# Patient Record
Sex: Male | Born: 1985 | Race: White | Hispanic: No | Marital: Single | Smoking: Never smoker
Health system: Southern US, Community
[De-identification: ages and names within clinical notes are randomized; demographics above are authoritative.]

---
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
## 2013-12-09 HISTORY — PX: FACIAL RECONSTRUCTION SURGERY: SHX631

## 2016-04-10 ENCOUNTER — Encounter (HOSPITAL_COMMUNITY): Payer: Self-pay | Admitting: Oncology

## 2016-04-10 ENCOUNTER — Emergency Department (HOSPITAL_COMMUNITY)
Admission: EM | Admit: 2016-04-10 | Discharge: 2016-04-10 | Disposition: A | Discharge status: Home / Self Care | Payer: Self-pay | Attending: Emergency Medicine | Admitting: Emergency Medicine

## 2016-04-10 ENCOUNTER — Emergency Department (HOSPITAL_COMMUNITY): Payer: Self-pay

## 2016-04-10 DIAGNOSIS — Y9289 Other specified places as the place of occurrence of the external cause: Secondary | ICD-10-CM | POA: Insufficient documentation

## 2016-04-10 DIAGNOSIS — S83005A Unspecified dislocation of left patella, initial encounter: Secondary | ICD-10-CM

## 2016-04-10 DIAGNOSIS — Y9389 Activity, other specified: Secondary | ICD-10-CM | POA: Insufficient documentation

## 2016-04-10 DIAGNOSIS — X500XXA Overexertion from strenuous movement or load, initial encounter: Secondary | ICD-10-CM | POA: Insufficient documentation

## 2016-04-10 DIAGNOSIS — S83015A Lateral dislocation of left patella, initial encounter: Secondary | ICD-10-CM | POA: Insufficient documentation

## 2016-04-10 DIAGNOSIS — Y990 Civilian activity done for income or pay: Secondary | ICD-10-CM | POA: Insufficient documentation

## 2016-04-10 MED ORDER — HYDROCODONE-ACETAMINOPHEN 5-325 MG PO TABS: 1.0000 | ORAL_TABLET | Freq: Four times a day (QID) | ORAL | Status: AC | PRN

## 2016-04-10 MED ORDER — PROPOFOL 10 MG/ML IV BOLUS
0.5000 mg/kg | Freq: Once | INTRAVENOUS | Status: AC
  Administered 2016-04-10: 49.9 mg via INTRAVENOUS
  Filled 2016-04-10: qty 20

## 2016-04-10 MED ORDER — HYDROMORPHONE HCL 1 MG/ML IJ SOLN: 1.0000 mg | Freq: Once | INTRAMUSCULAR | Status: DC

## 2016-04-10 MED ORDER — HYDROMORPHONE HCL 1 MG/ML IJ SOLN
1.0000 mg | Freq: Once | INTRAMUSCULAR | Status: AC
Start: 2016-04-10 — End: 2016-04-10
  Administered 2016-04-10: 1 mg via INTRAVENOUS
  Filled 2016-04-10: qty 1

## 2016-04-10 MED ORDER — HYDROMORPHONE HCL 2 MG/ML IJ SOLN
2.0000 mg | Freq: Once | INTRAMUSCULAR | Status: AC
  Administered 2016-04-10: 2 mg via INTRAVENOUS
  Filled 2016-04-10: qty 1

## 2016-04-10 NOTE — ED Notes (Signed)
Bed: WA15 Expected date:  Expected time:  Means of arrival:  Comments: EMS  

## 2016-04-10 NOTE — Consult Note (Signed)
Reason for Consult: Left knee pain Referring Physician:Dr Delo  Gordy SaversChristopher Madden is an 30 y.o. male.  HPI: Derek DeerChristopher is a 30 year old patient with left knee pain.  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  Had immediate onset of pain and deformity.  Was seen in the emergency room.  CT scan subsequently showed relatively transverse patellar dislocation.  He denies any prior history of injury to the knee.  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 No pertinent past medical history.  Past Surgical History  Procedure Laterality Date  . Facial reconstruction surgery  2015     to lip after trauma    No family history on file.  Social History:  reports that he has never smoked. He has never used smokeless tobacco. He reports that he does not drink alcohol or use illicit drugs.  Allergies: No Known Allergies  Medications: I have reviewed the patient's current medications.  No results found for this or any previous visit (from the past 48 hour(s)).  Ct Knee Left Wo Contrast  04/10/2016  CLINICAL DATA:  Twisting injury at work while lifting. Abnormal plain films. EXAM: CT OF THE left KNEE WITHOUT CONTRAST TECHNIQUE: Multidetector CT imaging of the left knee was performed according to the standard protocol. Multiplanar CT image reconstructions were also generated. COMPARISON:  Left knee radiographs 04/10/2016 FINDINGS: There is complete lateral dislocation of the patella with impaction of the medial surface of the patella on to the lateral anterior femoral condyle. There is impaction fracture along the lateral aspect of the lateral femoral condyle. Slight rotation of the femur with respect to the proximal tibia suggesting rotational subluxation of the femur towards the medial side with respect to the tibia. No complete dislocation of the femoral tibial joints. There is a small effusion.  Soft tissues are  unremarkable. IMPRESSION: Complete lateral patellar dislocation with impaction fracture along the lateral aspect of the lateral femoral condyles. Rotational subluxation of the femur with respect to the tibia. Electronically Signed   By: Burman NievesWilliam  Stevens M.D.   On: 04/10/2016 06:21   Dg Knee Complete 4 Views Left  04/10/2016  CLINICAL DATA:  Left knee injury with lifting and twisting injury. EXAM: LEFT KNEE - COMPLETE 4+ VIEW COMPARISON:  None. FINDINGS: Examination is limited by patient positioning and backboard artifact. The left femur appears rotated with respect to the tibia suggesting rotational dislocation. Patellar dislocation is not excluded. No evidence of acute fracture. Probable left knee effusion. No focal bone lesion or bone destruction. IMPRESSION: Left femur appears rotated with respect to the tibia suggesting dislocation. Possible patellar dislocation as well. Small effusion. No acute fractures. Electronically Signed   By: Burman NievesWilliam  Stevens M.D.   On: 04/10/2016 05:28    Review of Systems  Constitutional: Negative.   HENT: Negative.   Eyes: Negative.   Respiratory: Negative.   Cardiovascular: Negative.   Gastrointestinal: Negative.   Genitourinary: Negative.   Musculoskeletal: Positive for joint pain.  Skin: Negative.   Neurological: Negative.   Endo/Heme/Allergies: Negative.   Psychiatric/Behavioral: Negative.    Blood pressure 124/73, pulse 72, temperature 98.7 F (37.1 C), temperature source Oral, resp. rate 11, height 6\' 1"  (1.854 m), weight 99.791 kg (220 lb), SpO2 98 %. Physical Exam  Constitutional: He appears well-developed.  HENT:  Head: Normocephalic.  Eyes: Pupils are equal, round, and reactive to light.  Neck: Normal range of motion.  Cardiovascular: Normal rate.  Respiratory: Effort normal.  Neurological: He is alert.  Skin: Skin is warm.  Psychiatric: He has a normal mood and affect.   left knee demonstrates slight flexion deformity with loss of normal  anatomic landmarks ankle dorsiflexion and plantarflexion is intact pulses palpable foot is perfused and sensate holds leg Rotated position on both sides.  Compartments are soft.  Assessment/Plan: Impression is left knee patellar dislocation with slight impaction fracture of the lateral femoral condyle CT scan demonstrates normal relationship between the femur and tibia plan under conscious sedation the patient's patella is reduced.  Relatively stable after reduction.  Ace wrap compression wrap and knee immobilizer placed.  He will be allowed to be weightbearing as tolerated in the knee immobilizer for at least 2 weeks.  I do want him to keep his leg straight for 2 weeks and then come back to clinic.  We'll likely begin some range of motion at that time and potentially put him in a more functional brace for flexion.  He has no real predisposing factors to patellar including increased Q angle or valgus alignment of the lower extremity.  Quad strength is excellent so there is a reasonable chance that he may not require surgical intervention for continued instability.  patellar instability.  Plan is for follow-up with me in 2 weeks weightbearing as tolerated in knee immobilizer  Shaylah Mcghie SCOTT 04/10/2016, 7:14 AM

## 2016-04-10 NOTE — ED Provider Notes (Addendum)
CSN: [id number]     Arrival date & time 04/10/16  0445 History   First MD Initiated Contact with Patient 04/10/16 [phone]     Chief Complaint  Patient presents with  . Knee Injury     (Consider location/radiation/quality/duration/timing/severity/associated sxs/prior Treatment) HPI Comments: Patient is a 30 year old male with no significant past medical history. He presents for evaluation of severe left knee pain. 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 He was immediately unable to stand. He was brought here by ambulance complaining of severe pain.  Patient is a 30 y.o. male presenting with knee pain. The history is provided by the patient.  Knee Pain Location:  Knee Time since incident:  1 hour Injury: yes   Knee location:  L knee Pain details:    Quality:  Sharp   Radiates to:  Does not radiate   Severity:  Severe   Onset quality:  Sudden   Timing:  Constant   Progression:  Unchanged   History reviewed. No pertinent past medical history. Past Surgical History  Procedure Laterality Date  . Facial reconstruction surgery  2015     to lip after trauma   No family history on file. Social History  Substance Use Topics  . Smoking status: Never Smoker   . Smokeless tobacco: Never Used  . Alcohol Use: No    Review of Systems  All other systems reviewed and are negative.     Allergies  Review of patient's allergies indicates no known allergies.  Home Medications   Prior to Admission medications   Not on File   BP 108/68 mmHg  Pulse 78  Temp(Src) 98.7 F (37.1 C) (Oral)  Resp 15  Ht 6\' 1"  (1.854 m)  Wt 220 lb (99.791 kg)  BMI 29.03 kg/m2  SpO2 98% Physical Exam  Constitutional: He is oriented to person, place, and time. He appears well-developed and well-nourished. No distress.  HENT:  Head: Normocephalic and atraumatic.  Neck: Normal range of motion. Neck supple.  Musculoskeletal:  The left knee appears  to be externally rotated. He has severe pain with any touch or movement. Distal PMS is intact.  Neurological: He is alert and oriented to person, place, and time.  Skin: Skin is warm and dry. He is not diaphoretic.  Nursing note and vitals reviewed.   ED Course  .Sedation Date/Time: 04/10/2016 11:20 PM Performed by: Geoffery LyonsELO, Danaysia Rader Authorized by: Geoffery LyonsELO, Jamika Sadek  Consent:    Consent obtained:  Verbal   Consent given by:  Patient   Risks discussed:  Inadequate sedation, allergic reaction, dysrhythmia, prolonged hypoxia resulting in organ damage, prolonged sedation necessitating reversal and respiratory compromise necessitating ventilatory assistance and intubation Indications:    Sedation purpose:  Dislocation reduction   Procedure necessitating sedation performed by:  Physician performing sedation   Intended level of sedation:  Moderate (conscious sedation) Pre-sedation assessment:    ASA classification: class 1 - normal, healthy patient     Neck mobility: normal     Mouth opening:  3 or more finger widths   Thyromental distance:  4 finger widths   Pre-sedation assessments completed and reviewed: airway patency, cardiovascular function and mental status   Immediate pre-procedure details:    Reassessment: Patient reassessed immediately prior to procedure     Reviewed: vital signs     Verified: bag valve mask available, intubation equipment available and oxygen available   Procedure details (see MAR for exact  dosages):    Preoxygenation:  Nasal cannula   Sedation:  Propofol   Intra-procedure monitoring:  Blood pressure monitoring   Intra-procedure events: none    (including critical care time) Labs Review Labs Reviewed - No data to display  Imaging Review No results found. I have personally reviewed and evaluated these images and lab results as part of my medical decision-making.   EKG Interpretation None      MDM   Final diagnoses:  None    Patient presents with  complaints of severe knee pain after lifting a heavy object at work. His x-rays show a patellar dislocation with possible dislocation of the knee joint. I discussed this finding with Dr. August Saucer from orthopedics who is recommending a CT scan. This was obtained and reveals a patellar dislocation with a possible subluxation of the femur. He was evaluated in the ER and conscious sedation was performed. Dr. August Saucer then reduced the patellar dislocation. The patient will be placed in a knee immobilizer and followed up in the office in 2 weeks.      Geoffery Lyons, MD 04/10/16 2321  ADDENDUM:  Total procedural sedation time was approximately 20 minutes. I was present in the room for the entire procedure.  Geoffery Lyons, MD 04/21/16 1332

## 2016-04-10 NOTE — ED Notes (Signed)
Knee immobilizer applied by physician at time of reduction.

## 2016-04-10 NOTE — ED Notes (Signed)
One of his supervisors arrived here to see him at about 0830 and remains with him.  Pt. Is in no distress with his knee immobilizer remaining in place.  He asks if he may wait in rm. Until his mother (his ride home) arrives and I assure him we are happy to do this.

## 2016-04-10 NOTE — ED Notes (Signed)
Per ortho surgeon, hold conscious sedation until a CT can be preformed.

## 2016-04-10 NOTE — ED Notes (Signed)
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  Pt immediately went down to the ground.  Obvious deformity noted to left knee.  Pt given 250 mcg of fentanyl en route.  Now rating his pain 4/10.

## 2016-04-10 NOTE — Discharge Instructions (Signed)
Where knee immobilizer as applied as recommended by Dr. August Saucerean.  Apply ice for 20 minutes every 2 hours while awake for the next 2 days. Keep your leg elevated.  Follow-up in 2 weeks with Dr. August Saucerean. His contact information has been provided in this discharge summary for you to arrange this appointment.  Hydrocodone as prescribed as needed for pain.   Patellar Dislocation A patellar dislocation occurs when your kneecap (patella) slips out of its normal position in a groove in front of the lower end of your thighbone (femur). This groove is called the patellofemoral groove.  CAUSES The kneecap is normally positioned over the front of the knee joint at the base of the thighbone. A kneecap can be dislocated when:  The kneecap is out of place (patellar tracking disorder), and force is applied.  The foot is firmly planted pointing outward, and the knee bends with the thigh turned inward. This kind of injury is common during many sports activities.  The inner edge of the kneecap is hit, pushing it toward the outer side of the leg. SIGNS AND SYMPTOMS  Severe pain.  A misshapen knee that looks like a bone is out of position.  A popping sensation, followed by a feeling that something is out of place.  Inability to bend or straighten the knee.  Knee swelling.  Cool, pale skin or numbness and tingling in or below the affected knee. DIAGNOSIS  Your health care provider will physically examine the injured area. An X-ray exam may be done to make sure a bone fracture has not occurred. In some cases, your health care provider may look inside your knee joint with an instrument much like a pencil-sized telescope (arthroscope). This may be done to make sure you have no loose cartilage in your joint. Loose cartilage is not visible on an X-ray image. TREATMENT  In many instances, the patella can be guided back into position without much difficulty. It often goes back into position by straightening the leg.  Often, nothing more may be needed other than a brief period of immobilization followed by the exercises your health care provider recommends. If patellar dislocation starts to become frequent after the first incident, surgery may be needed to prevent your patella from slipping out of place. HOME CARE INSTRUCTIONS   Only take over-the-counter or prescription medicines for pain, discomfort, or fever as directed by your health care provider.  Use a knee brace if directed to do so by your health care provider.  Use crutches as instructed.  Apply ice to the injured knee:  Put ice in a plastic bag.  Place a towel between your skin and the bag.  Leave the ice on for 20 minutes, 2-3 times a day.  Follow your health care provider's instructions for doing any recommended range-of-motion exercises or other exercises. SEEK IMMEDIATE MEDICAL CARE IF:  You have increased pain or swelling in the knee that is not relieved with medicine.  You have increasing inflammation in the knee.  You have locking or catching of your knee. MAKE SURE YOU:  Understand these instructions.  Will watch your condition.  Will get help right away if you are not doing well or get worse.   This information is not intended to replace advice given to you by your health care provider. Make sure you discuss any questions you have with your health care provider.   Document Released: 08/20/2001 Document Revised: 09/15/2013 Document Reviewed: 07/07/2013 Elsevier Interactive Patient Education [employer]2016 [employer]

## 2016-04-10 NOTE — ED Notes (Addendum)
Conscious Sedation for reduction of left patella.   Consent signed, O2 set up, pt on monitor, CO2 monitor placed on monitor, VS cycling Q 5 min, Ambu bag setup, suction setup, crash cart at bedside, confirmed IV is patent, NS infusing.  Present at bedside [id number] Dr. Wyn Forsterean, Orth, Dr. Damita Lackelo, Terri, RN  [phone] Time out preformed 0700 49.9 propofol given by Camelia Engerri, RN 0700 pt still aware 0701 v/o from Dr. Judd Lienelo for 20 mg of propofol, pushed by Camelia Engerri, RN [phone] pt still aware 0702 v/o by Dr. Judd Lienelo for 20 mg propofol, pushed by Camelia Engerri, RN [phone] pt still awake 0703 Dr. August Saucerean reduced patella. [phone] pt is still awake.  VSS remained stable.  Sedation not achieved however pt was able to tolerated reduction.  Knee immobilizer applied by Dr. August Saucerean.

## 2016-04-10 NOTE — ED Notes (Signed)
Contact is [employer]/[facility] [phone]

## 2017-03-19 IMAGING — DX DG KNEE COMPLETE 4+V*L*
5 series · 5 of 5 positions shown · non-contrast
Comparison: None.

CLINICAL DATA: Left knee injury with lifting and twisting injury.

EXAM:
LEFT KNEE - COMPLETE 4+ VIEW

[knee lat (1 of 2)]
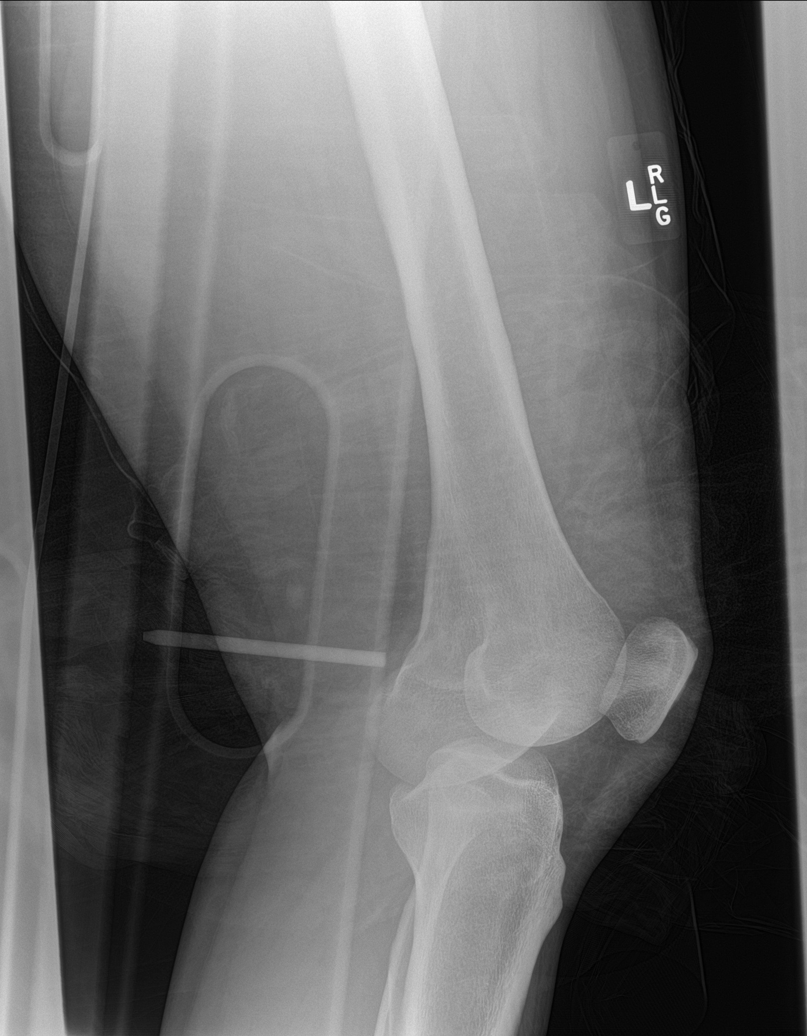

[knee lat (2 of 2)]
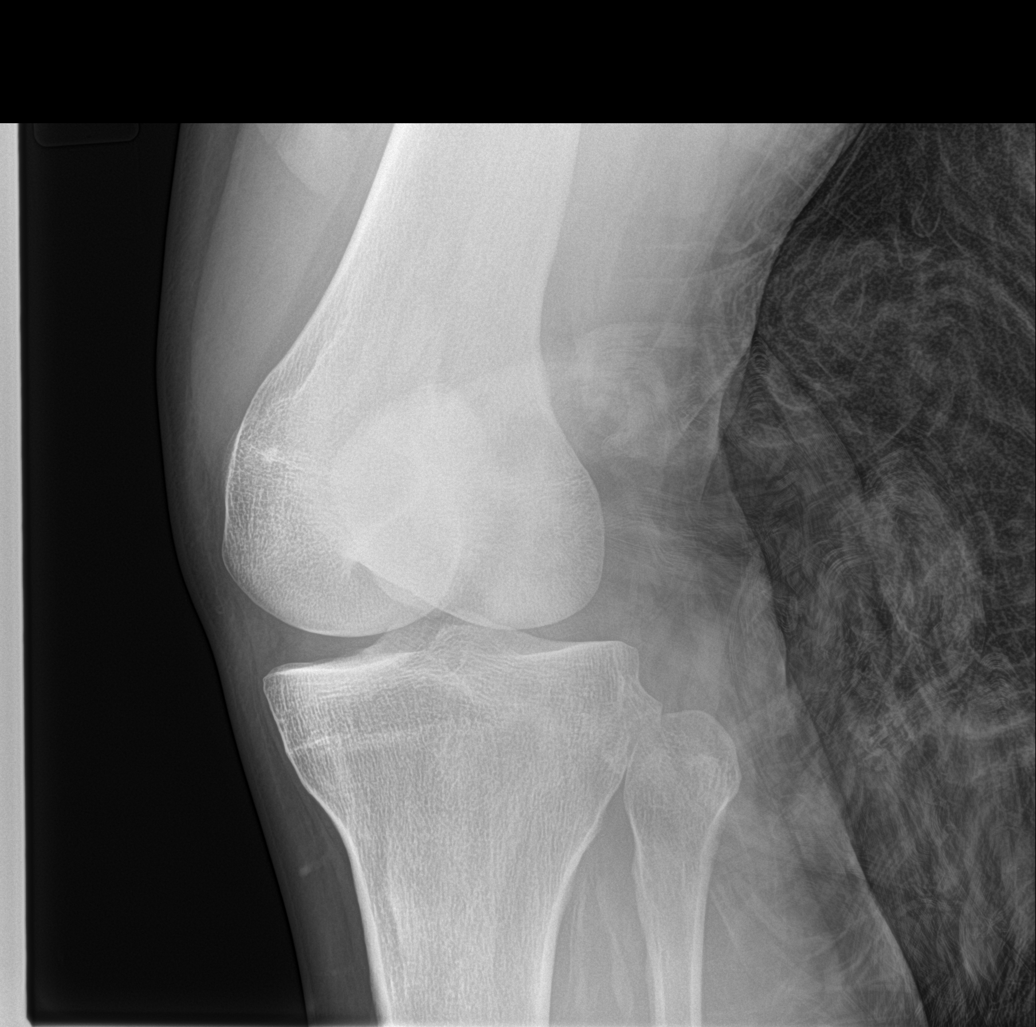

[knee ap (1 of 3)]
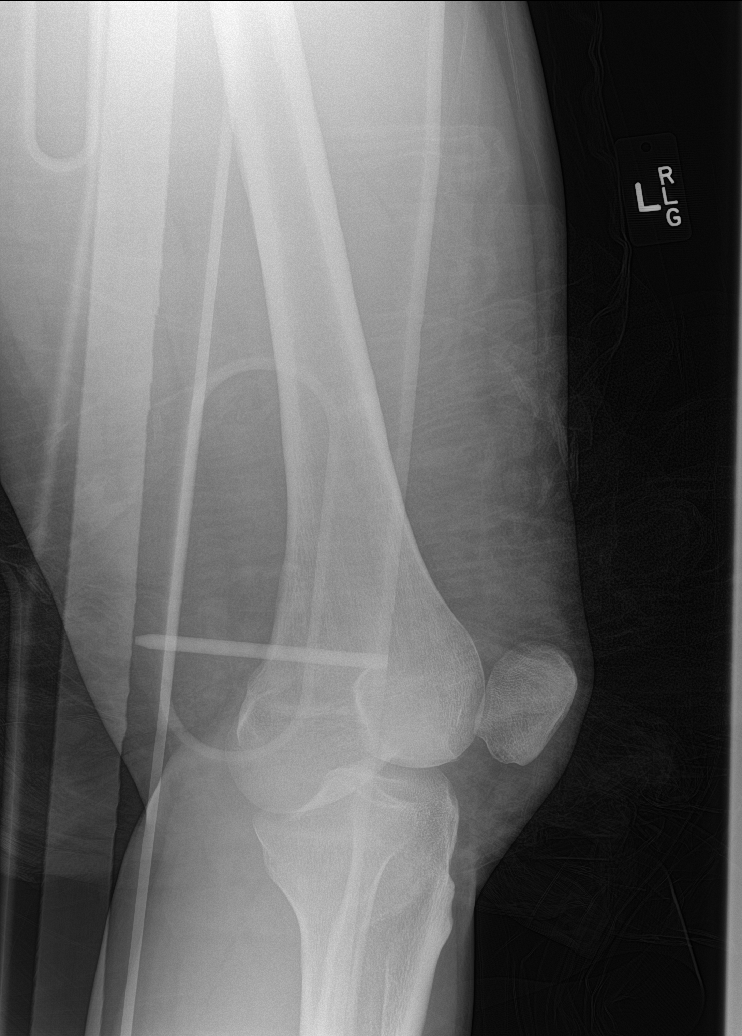

[knee ap (2 of 3)]
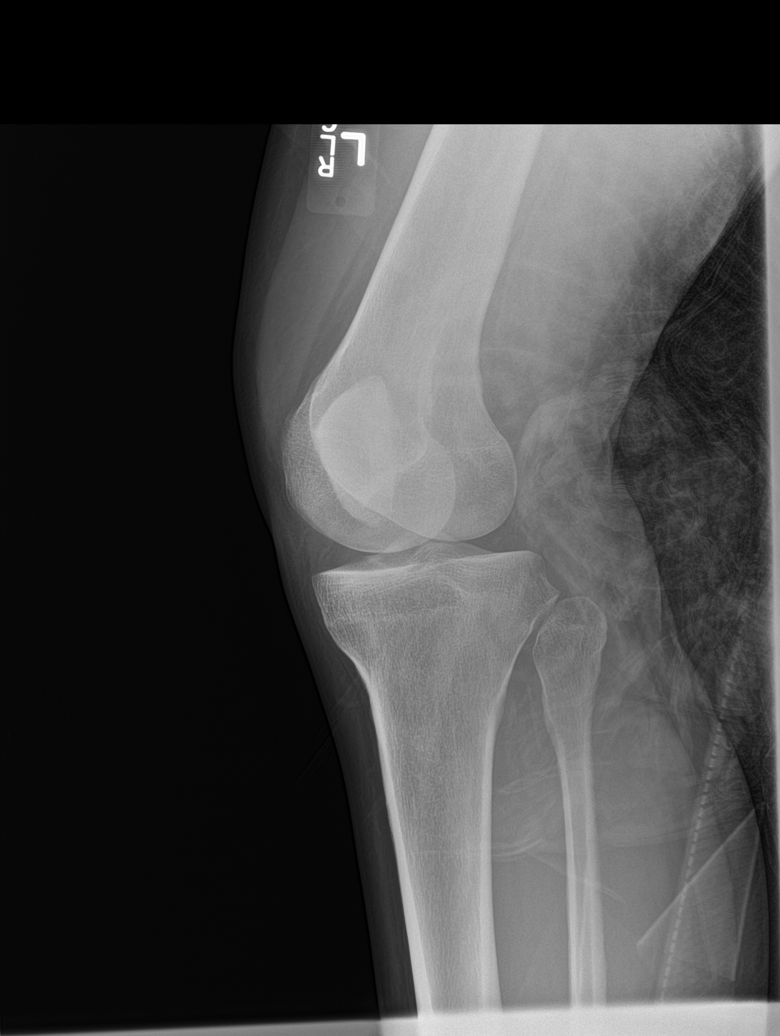

[knee ap (3 of 3)]
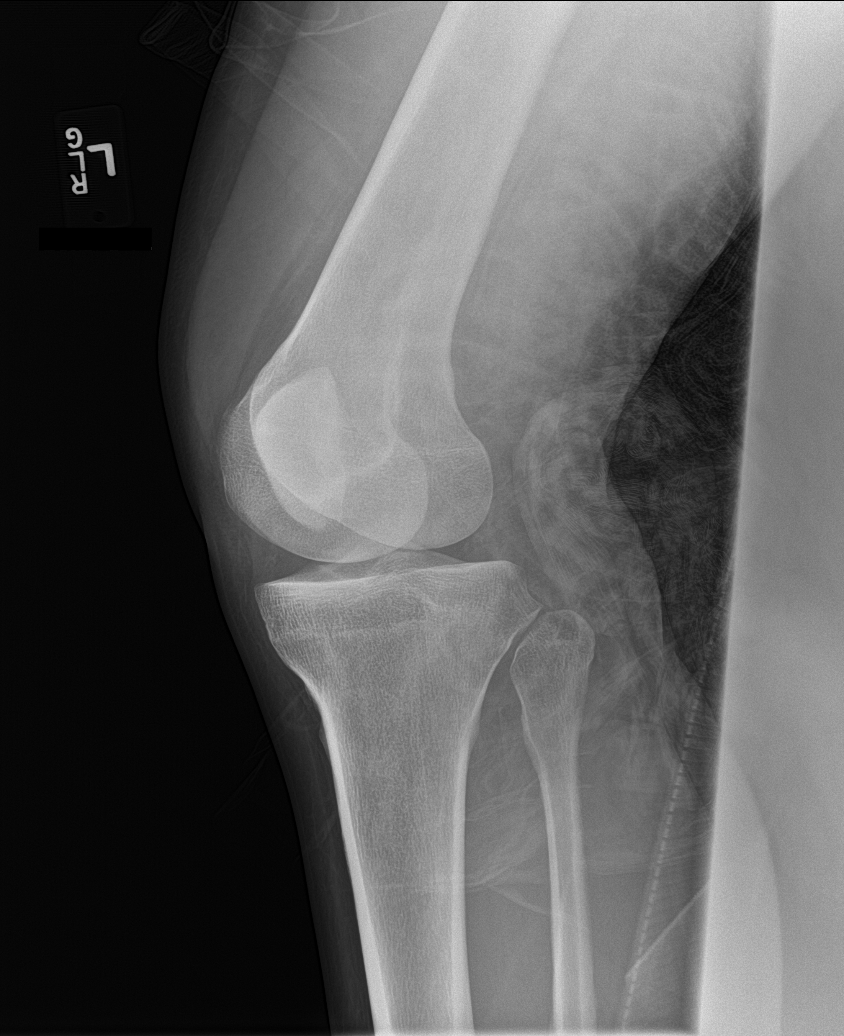

[5 of 5 positions shown; findings below may reference images not displayed]

FINDINGS: Examination is limited by patient positioning and backboard
artifact. The left femur appears rotated with respect to the tibia
suggesting rotational dislocation. Patellar dislocation is not
excluded. No evidence of acute fracture. Probable left knee
effusion. No focal bone lesion or bone destruction.
IMPRESSION: Left femur appears rotated with respect to the tibia suggesting
dislocation. Possible patellar dislocation as well. Small effusion.
No acute fractures.
